# Patient Record
Sex: Male | Born: 1980 | Race: White | Hispanic: No | Marital: Married | Smoking: Current every day smoker
Health system: Southern US, Community
[De-identification: ages and names within clinical notes are randomized; demographics above are authoritative.]

## PROBLEM LIST (undated history)

## (undated) DIAGNOSIS — K219 Gastro-esophageal reflux disease without esophagitis: Secondary | ICD-10-CM

## (undated) DIAGNOSIS — I10 Essential (primary) hypertension: Secondary | ICD-10-CM

## (undated) HISTORY — PX: WISDOM TOOTH EXTRACTION: SHX21

---

## 2011-10-15 ENCOUNTER — Ambulatory Visit (INDEPENDENT_AMBULATORY_CARE_PROVIDER_SITE_OTHER): Payer: 59

## 2011-10-15 DIAGNOSIS — R52 Pain, unspecified: Secondary | ICD-10-CM

## 2011-10-15 DIAGNOSIS — L02219 Cutaneous abscess of trunk, unspecified: Secondary | ICD-10-CM

## 2011-10-17 ENCOUNTER — Ambulatory Visit (INDEPENDENT_AMBULATORY_CARE_PROVIDER_SITE_OTHER): Payer: 59

## 2011-10-17 DIAGNOSIS — L02219 Cutaneous abscess of trunk, unspecified: Secondary | ICD-10-CM

## 2011-10-17 DIAGNOSIS — L03319 Cellulitis of trunk, unspecified: Secondary | ICD-10-CM

## 2011-10-19 ENCOUNTER — Ambulatory Visit (INDEPENDENT_AMBULATORY_CARE_PROVIDER_SITE_OTHER): Payer: 59

## 2011-10-19 DIAGNOSIS — L03319 Cellulitis of trunk, unspecified: Secondary | ICD-10-CM

## 2011-10-21 ENCOUNTER — Ambulatory Visit (INDEPENDENT_AMBULATORY_CARE_PROVIDER_SITE_OTHER): Payer: 59 | Admitting: Physician Assistant

## 2011-10-21 VITALS — BP 142/84 | HR 68 | Temp 98.9°F | Resp 16 | Ht 75.5 in | Wt 238.0 lb

## 2011-10-21 DIAGNOSIS — L02219 Cutaneous abscess of trunk, unspecified: Secondary | ICD-10-CM

## 2011-10-21 DIAGNOSIS — L03319 Cellulitis of trunk, unspecified: Secondary | ICD-10-CM

## 2011-10-21 NOTE — Patient Instructions (Signed)
Complete Doxycycline as instructed.  No additional antibiotic should be necessary. Warm compresses to the area 2-3 times daily.

## 2011-10-21 NOTE — Progress Notes (Signed)
  Subjective:    Patient ID: Ryan Shannon, male    DOB: [birth year], 31 y.o.   MRN: [id number]  Wound Check He was originally treated 5 to 10 days ago. Previous treatment included I&D of abscess and oral antibiotics. The maximum temperature noted was less than 100.4 F. The temperature was taken using an oral thermometer. There has been bloody discharge from the wound. The redness has improved. The swelling has improved. The pain has improved. He has no difficulty moving the affected extremity or digit.      Review of Systems  HENT: Positive for sore throat (resolved with increased PO fluid).   Cardiovascular: Negative for chest pain.  Gastrointestinal: Negative for nausea, vomiting, diarrhea and constipation.  Genitourinary: Negative for dysuria and frequency.  Skin: Positive for wound. Negative for color change, pallor and rash.       Objective:   Physical Exam  Constitutional: He appears well-developed and well-nourished. No distress.  HENT:  Head: Normocephalic.  Eyes: Conjunctivae are normal.  Skin: Skin is warm and dry. Lesion noted. He is not diaphoretic.      Dressing removed, packing removed.  Additional sebaceous material expressed from lower pole of wound, consistent with communicating second lesion.  Irrigated wound with 3 cc 2% lidocaine plain.  Packed with 1/4 in. plain packing.  Dressed.       Assessment & Plan:

## 2011-10-23 ENCOUNTER — Ambulatory Visit (INDEPENDENT_AMBULATORY_CARE_PROVIDER_SITE_OTHER): Payer: 59 | Admitting: Physician Assistant

## 2011-10-23 VITALS — BP 143/79 | HR 83 | Temp 98.7°F | Resp 16 | Ht 75.5 in | Wt 243.0 lb

## 2011-10-23 DIAGNOSIS — L03319 Cellulitis of trunk, unspecified: Secondary | ICD-10-CM

## 2011-10-23 DIAGNOSIS — L02219 Cutaneous abscess of trunk, unspecified: Secondary | ICD-10-CM

## 2011-10-23 NOTE — Progress Notes (Signed)
  Subjective:    Patient ID: Ryan Shannon, male    DOB: 1980/12/09, 31 y.o.   MRN: [id number]  HPI Presents for wound care. Infected sebaceous cyst status post I and D. On 10/15/2011. Has completed doxycycline. Feels good.   Review of Systems Continues to have drainage from the surgical site. No pain.    Objective:   Physical Exam  Vitals reviewed. Constitutional: He appears well-developed and well-nourished.  Neurological: He is alert.  Skin: Skin is warm and dry. No rash noted. There is erythema (mild erythema surrounding surgical site.  ). No pallor.     mild induration surrounds the surgical site. Additional sebaceous material is expressed with deep palpation in the lower pole. The wound cavity was irrigated with 2 cc of 2% lidocaine plain. The cavity was then gently packed with quarter inch plain packing. Dressing applied.        Assessment & Plan:  Infected sebaceous cyst, mid back, status post I and D on 10/15/11.  Continue warm compresses. Reevaluate with me in 48 hours.

## 2011-10-25 ENCOUNTER — Ambulatory Visit (INDEPENDENT_AMBULATORY_CARE_PROVIDER_SITE_OTHER): Payer: 59 | Admitting: Physician Assistant

## 2011-10-25 VITALS — BP 130/90 | HR 76 | Temp 98.9°F | Resp 18 | Ht 75.5 in | Wt 242.0 lb

## 2011-10-25 DIAGNOSIS — L02219 Cutaneous abscess of trunk, unspecified: Secondary | ICD-10-CM

## 2011-10-25 DIAGNOSIS — L03319 Cellulitis of trunk, unspecified: Secondary | ICD-10-CM

## 2011-10-25 NOTE — Progress Notes (Signed)
  Subjective:    Patient ID: Ryan Shannon, male    DOB: 10-29-1980, 31 y.o.   MRN: [id number]  HPI  Presents for wound care. Infected sebaceous cyst status post I and D. On 10/15/2011. Has completed doxycycline. Feels good.   Review of Systems  Continues to have drainage from the surgical site. No pain.    Objective:   Physical Exam  Vitals reviewed. Constitutional: He appears well-developed and well-nourished.  Neurological: He is alert.  Skin: Skin is warm and dry. No rash noted. No erythema. No pallor.     mild induration surrounds the surgical site. Additional sebaceous material is expressed with deep palpation in the lower pole. The wound cavity was irrigated with 2 cc of 2% lidocaine plain. The cavity was then gently packed with quarter inch plain packing. Dressing applied.        Assessment & Plan:  Infected sebaceous cyst, mid back, status post I and D on 10/15/11.  Continue warm compresses. Reevaluate with Ryan Candle, PA-C in 48 hours.

## 2011-10-27 ENCOUNTER — Ambulatory Visit (INDEPENDENT_AMBULATORY_CARE_PROVIDER_SITE_OTHER): Payer: 59 | Admitting: Physician Assistant

## 2011-10-27 VITALS — BP 151/81 | HR 56 | Temp 98.0°F | Resp 18 | Ht 75.5 in | Wt 238.8 lb

## 2011-10-27 DIAGNOSIS — L02212 Cutaneous abscess of back [any part, except buttock]: Secondary | ICD-10-CM

## 2011-10-27 DIAGNOSIS — L02219 Cutaneous abscess of trunk, unspecified: Secondary | ICD-10-CM

## 2011-10-27 NOTE — Patient Instructions (Signed)

## 2011-10-27 NOTE — Progress Notes (Signed)
   Patient ID: Ryan Shannon MRN: [id number], DOB: 1981-09-08 31 y.o. Date of Encounter: 10/27/2011, 4:36 PM  Primary Physician: No primary provider on file.  Chief Complaint: Wound care   See previous note  HPI: 31 y.o. y/o Caucasian male presents for wound care of cellulitis/abscess of mid back s/p I&D on 10/15/11. Doing well No issues or complaints Afebrile/ no chills No nausea or vomiting Finished Doxycycline No pain Previous notes reviewed  No past medical history on file.   Home Meds: Prior to Admission medications   Not on File    Allergies: No Known Allergies  ROS: Constitutional: Afebrile, no chills Cardiovascular: negative for chest pain or palpitations Dermatological: Positive for wound. Negative for erythema, pain, or warmth GI: No nausea or vomiting   EXAM: Physical Exam: Blood pressure 151/81, pulse 56, temperature 98 F (36.7 C), temperature source Oral, resp. rate 18, height 6' 3.5" (1.918 m), weight 238 lb 12.8 oz (108.319 kg). General: Well developed, well nourished, in no acute distress. Nontoxic appearing. Head: Normocephalic, atraumatic, sclera non-icteric.  Neck: Supple. Lungs: Breathing is unlabored. Heart: Normal rate Skin:  Warm and moist. Dressing and packing in place along mid back. No induration, erythema, tenderness to palpation. Neuro: Alert and oriented X 3. Moves all extremities spontaneously. Normal gait.  Psych:  Responds to questions appropriately with a normal affect.   PROCEDURE: Dressing and packing removed. Small amount of sebaceous material expressed from inferior aspect of surgical site Wound bed healthy Irrigated with 1% plain lidocaine 5 cc. Repacked with 1/4 plain packing Dressing applied  LAB: Culture: reviewed. Multiple organisms present.   A/P: 31 y.o. y/o male with cellulitis/abscess as above s/p I&D on 10/15/11 -wound care per above -Finished Doxycycline -No pain -Daily dressing changes -Recheck 48  hours  Signed, Rodell Marrs, PA-C 10/27/2011, 4:43 PM

## 2011-10-29 ENCOUNTER — Ambulatory Visit (INDEPENDENT_AMBULATORY_CARE_PROVIDER_SITE_OTHER): Payer: 59 | Admitting: Physician Assistant

## 2011-10-29 VITALS — BP 144/80 | HR 78 | Temp 98.1°F | Resp 16 | Ht 75.25 in | Wt 242.2 lb

## 2011-10-29 DIAGNOSIS — L02219 Cutaneous abscess of trunk, unspecified: Secondary | ICD-10-CM

## 2011-10-29 DIAGNOSIS — L0390 Cellulitis, unspecified: Secondary | ICD-10-CM

## 2011-10-29 MED ORDER — DOXYCYCLINE HYCLATE 100 MG PO TABS: 100.0000 mg | ORAL_TABLET | Freq: Two times a day (BID) | ORAL | Status: AC

## 2011-10-29 MED ORDER — MUPIROCIN 2 % EX OINT: TOPICAL_OINTMENT | Freq: Three times a day (TID) | CUTANEOUS | Status: AC

## 2011-10-29 NOTE — Progress Notes (Signed)
  Subjective:    Patient ID: Ryan Shannon, male    DOB: [birth year], 31 y.o.   MRN: [id number]  HPIs/p I and D 10/15/2011.  No pain.  Doing great. Here for repacking.  Pt. c/o of getting new small pimple on buttocks    Review of Systems neg     Objective:   Physical Exam Center of back-packing removed.  Small, round opening with good granulation at base and no sign of infection or drainage.  Mupirocin 2% ointment used to lubricate packing and repacked w 1/4" plain packing ~5cm.  Telfa.       Assessment & Plan:  Reorder Doxy.  Start Mupirocin 2% on "pimple" on buttocks and in nares. See avs

## 2014-01-18 ENCOUNTER — Ambulatory Visit (INDEPENDENT_AMBULATORY_CARE_PROVIDER_SITE_OTHER): Payer: BC Managed Care – PPO | Admitting: Emergency Medicine

## 2014-01-18 VITALS — BP 126/80 | HR 94 | Temp 98.5°F | Resp 16 | Ht 75.5 in | Wt 255.0 lb

## 2014-01-18 DIAGNOSIS — L02219 Cutaneous abscess of trunk, unspecified: Secondary | ICD-10-CM

## 2014-01-18 DIAGNOSIS — L03319 Cellulitis of trunk, unspecified: Secondary | ICD-10-CM

## 2014-01-18 DIAGNOSIS — L02215 Cutaneous abscess of perineum: Secondary | ICD-10-CM

## 2014-01-18 MED ORDER — SULFAMETHOXAZOLE-TMP DS 800-160 MG PO TABS: 2.0000 | ORAL_TABLET | Freq: Two times a day (BID) | ORAL | Status: DC

## 2014-01-18 MED ORDER — ACETAMINOPHEN-CODEINE #3 300-30 MG PO TABS: 1.0000 | ORAL_TABLET | ORAL | Status: DC | PRN

## 2014-01-18 NOTE — Progress Notes (Signed)
[facility] [address], [place] [id number] [phone]  Date:  01/18/2014   Name:  Ryan Shannon   DOB:  December 08, 1980   MRN:  [id number]  PCP:  No primary provider on file.    Chief Complaint: abcess between legs   History of Present Illness:  Ryan Kearnsicholas C Cullen is a 33 y.o. very pleasant male patient who presents with the following:  Has mass in perineum for past two days.  Increasing tenderness and pain.  No drainage.  No fever or chills.  No improvement with over the counter medications or other home remedies. Denies other complaint or health concern today.   There are no active problems to display for this patient.   History reviewed. No pertinent past medical history.  History reviewed. No pertinent past surgical history.  History  Substance Use Topics  . Smoking status: Current Every Day Smoker -- 0.60 packs/day for 8 years    Types: Cigarettes  . Smokeless tobacco: Never Used  . Alcohol Use: 1.2 oz/week    2 Cans of beer per week    Family History  Problem Relation Age of Onset  . Diabetes Mother     No Known Allergies  Medication list has been reviewed and updated.  No current outpatient prescriptions on file prior to visit.   No current facility-administered medications on file prior to visit.    Review of Systems:  As per HPI, otherwise negative.    Physical Examination: Filed Vitals:   01/18/14 1222  BP: 126/80  Pulse: 94  Temp: 98.5 F (36.9 C)  Resp: 16   Filed Vitals:   01/18/14 1222  Height: 6' 3.5" (1.918 m)  Weight: 255 lb (115.667 kg)   Body mass index is 31.44 kg/(m^2). Ideal Body Weight: Weight in (lb) to have BMI = 25: 202.3   GEN: WDWN, NAD, Non-toxic, Alert & Oriented x 3 HEENT: Atraumatic, Normocephalic.  Ears and Nose: No external deformity. EXTR: No clubbing/cyanosis/edema NEURO: Normal gait.  PSYCH: Normally interactive. Conversant. Not depressed or anxious appearing.  Calm demeanor.   Genitalia:  Normal male circumcised Abscess on perineum in median raphe.  Assessment and Plan: Abscess Septra Surgical consultation Red flags reviewed  Signed,  Phillips OdorJeffery Kashis Penley, MD

## 2014-01-18 NOTE — Patient Instructions (Signed)
Abscess An abscess is an infected area that contains a collection of pus and debris.It can occur in almost any part of the body. An abscess is also known as a furuncle or boil. CAUSES  An abscess occurs when tissue gets infected. This can occur from blockage of oil or sweat glands, infection of hair follicles, or a minor injury to the skin. As the body tries to fight the infection, pus collects in the area and creates pressure under the skin. This pressure causes pain. People with weakened immune systems have difficulty fighting infections and get certain abscesses more often.  SYMPTOMS Usually an abscess develops on the skin and becomes a painful mass that is red, warm, and tender. If the abscess forms under the skin, you may feel a moveable soft area under the skin. Some abscesses break open (rupture) on their own, but most will continue to get worse without care. The infection can spread deeper into the body and eventually into the bloodstream, causing you to feel ill.  DIAGNOSIS  Your caregiver will take your medical history and perform a physical exam. A sample of fluid may also be taken from the abscess to determine what is causing your infection. TREATMENT  Your caregiver may prescribe antibiotic medicines to fight the infection. However, taking antibiotics alone usually does not cure an abscess. Your caregiver may need to make a small cut (incision) in the abscess to drain the pus. In some cases, gauze is packed into the abscess to reduce pain and to continue draining the area. HOME CARE INSTRUCTIONS   Only take over-the-counter or prescription medicines for pain, discomfort, or fever as directed by your caregiver.  If you were prescribed antibiotics, take them as directed. Finish them even if you start to feel better.  If gauze is used, follow your caregiver's directions for changing the gauze.  To avoid spreading the infection:  Keep your draining abscess covered with a  bandage.  Wash your hands well.  Do not share personal care items, towels, or whirlpools with others.  Avoid skin contact with others.  Keep your skin and clothes clean around the abscess.  Keep all follow-up appointments as directed by your caregiver. SEEK MEDICAL CARE IF:   You have increased pain, swelling, redness, fluid drainage, or bleeding.  You have muscle aches, chills, or a general ill feeling.  You have a fever. MAKE SURE YOU:   Understand these instructions.  Will watch your condition.  Will get help right away if you are not doing well or get worse. Document Released: 06/17/2005 Document Revised: 03/08/2012 Document Reviewed: 11/20/2011 [employer] Patient Information 2014 [employer].  

## 2014-11-01 ENCOUNTER — Ambulatory Visit
Admission: RE | Admit: 2014-11-01 | Discharge: 2014-11-01 | Disposition: A | Discharge status: Home / Self Care | Payer: BLUE CROSS/BLUE SHIELD | Source: Ambulatory Visit | Attending: Chiropractic Medicine | Admitting: Chiropractic Medicine

## 2014-11-01 ENCOUNTER — Other Ambulatory Visit: Payer: Self-pay | Admitting: Chiropractic Medicine

## 2014-11-01 DIAGNOSIS — M542 Cervicalgia: Secondary | ICD-10-CM

## 2014-11-01 DIAGNOSIS — M545 Low back pain: Secondary | ICD-10-CM
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
## 2015-08-30 ENCOUNTER — Other Ambulatory Visit: Payer: Self-pay | Admitting: Emergency Medicine

## 2018-01-19 DEATH — deceased

## 2024-08-03 ENCOUNTER — Emergency Department (HOSPITAL_COMMUNITY)

## 2024-08-03 ENCOUNTER — Ambulatory Visit (HOSPITAL_COMMUNITY)
Admission: EM | Admit: 2024-08-03 | Discharge: 2024-08-03 | Disposition: A | Discharge status: Home / Self Care | Attending: Emergency Medicine | Admitting: Emergency Medicine

## 2024-08-03 ENCOUNTER — Emergency Department (HOSPITAL_COMMUNITY): Admitting: Certified Registered Nurse Anesthetist

## 2024-08-03 ENCOUNTER — Encounter (HOSPITAL_COMMUNITY)
Admission: EM | Disposition: A | Discharge status: Home / Self Care | Payer: Self-pay | Source: Home / Self Care | Attending: Emergency Medicine

## 2024-08-03 ENCOUNTER — Encounter (HOSPITAL_COMMUNITY): Payer: Self-pay

## 2024-08-03 ENCOUNTER — Other Ambulatory Visit: Payer: Self-pay

## 2024-08-03 DIAGNOSIS — K219 Gastro-esophageal reflux disease without esophagitis: Secondary | ICD-10-CM | POA: Insufficient documentation

## 2024-08-03 DIAGNOSIS — K3580 Unspecified acute appendicitis: Secondary | ICD-10-CM

## 2024-08-03 DIAGNOSIS — K381 Appendicular concretions: Secondary | ICD-10-CM | POA: Diagnosis not present

## 2024-08-03 DIAGNOSIS — I10 Essential (primary) hypertension: Secondary | ICD-10-CM | POA: Insufficient documentation

## 2024-08-03 DIAGNOSIS — K353 Acute appendicitis with localized peritonitis, without perforation or gangrene: Secondary | ICD-10-CM | POA: Insufficient documentation

## 2024-08-03 DIAGNOSIS — F17210 Nicotine dependence, cigarettes, uncomplicated: Secondary | ICD-10-CM | POA: Diagnosis not present

## 2024-08-03 HISTORY — DX: Gastro-esophageal reflux disease without esophagitis: K21.9

## 2024-08-03 HISTORY — PX: LAPAROSCOPIC APPENDECTOMY: SHX408

## 2024-08-03 HISTORY — DX: Essential (primary) hypertension: I10

## 2024-08-03 LAB — GLUCOSE, CAPILLARY
Glucose-Capillary: 247 mg/dL — ABNORMAL HIGH (ref 70–99)
Glucose-Capillary: 208 mg/dL — ABNORMAL HIGH (ref 70–99)

## 2024-08-03 LAB — CBC
HCT: 49.2 % (ref 39.0–52.0)
Hemoglobin: 17.7 g/dL — ABNORMAL HIGH (ref 13.0–17.0)
MCH: 31 pg (ref 26.0–34.0)
MCHC: 36 g/dL (ref 30.0–36.0)
MCV: 86.2 fL (ref 80.0–100.0)
Platelets: 178 K/uL (ref 150–400)
RBC: 5.71 MIL/uL (ref 4.22–5.81)
RDW: 11.7 % (ref 11.5–15.5)
WBC: 15.2 K/uL — ABNORMAL HIGH (ref 4.0–10.5)
nRBC: 0 % (ref 0.0–0.2)

## 2024-08-03 LAB — COMPREHENSIVE METABOLIC PANEL WITH GFR
ALT: 157 U/L — ABNORMAL HIGH (ref 0–44)
AST: 84 U/L — ABNORMAL HIGH (ref 15–41)
Albumin: 3.8 g/dL (ref 3.5–5.0)
Alkaline Phosphatase: 101 U/L (ref 38–126)
Anion gap: 11 (ref 5–15)
BUN: 15 mg/dL (ref 6–20)
CO2: 22 mmol/L (ref 22–32)
Calcium: 9.1 mg/dL (ref 8.9–10.3)
Chloride: 100 mmol/L (ref 98–111)
Creatinine, Ser: 0.74 mg/dL (ref 0.61–1.24)
GFR, Estimated: >60 mL/min (ref >60)
Glucose, Bld: 254 mg/dL — ABNORMAL HIGH (ref 70–99)
Potassium: 3.9 mmol/L (ref 3.5–5.1)
Sodium: 133 mmol/L — ABNORMAL LOW (ref 135–145)
Total Bilirubin: 1.1 mg/dL (ref 0.0–1.2)
Total Protein: 6.6 g/dL (ref 6.5–8.1)

## 2024-08-03 LAB — LIPASE, BLOOD: Lipase: 34 U/L (ref 11–51)

## 2024-08-03 SURGERY — APPENDECTOMY, LAPAROSCOPIC
Anesthesia: General

## 2024-08-03 MED ORDER — ACETAMINOPHEN 10 MG/ML IV SOLN: 1000.0000 mg | Freq: Once | INTRAVENOUS | Status: DC | PRN

## 2024-08-03 MED ORDER — LIDOCAINE 2% (20 MG/ML) 5 ML SYRINGE
INTRAMUSCULAR | Status: AC
Start: 2024-08-03 — End: 2024-08-03
  Filled 2024-08-03: qty 5

## 2024-08-03 MED ORDER — SUCCINYLCHOLINE CHLORIDE 200 MG/10ML IV SOSY
PREFILLED_SYRINGE | INTRAVENOUS | Status: AC
  Filled 2024-08-03: qty 10

## 2024-08-03 MED ORDER — ROCURONIUM BROMIDE 10 MG/ML (PF) SYRINGE
PREFILLED_SYRINGE | INTRAVENOUS | Status: AC
Start: 2024-08-03 — End: 2024-08-03
  Filled 2024-08-03: qty 10

## 2024-08-03 MED ORDER — OXYCODONE HCL 5 MG PO TABS: 5.0000 mg | ORAL_TABLET | Freq: Four times a day (QID) | ORAL | 0 refills | Status: AC | PRN

## 2024-08-03 MED ORDER — MIDAZOLAM HCL (PF) 2 MG/2ML IJ SOLN
INTRAMUSCULAR | Status: DC | PRN
Start: 2024-08-03 — End: 2024-08-03
  Administered 2024-08-03: 2 mg via INTRAVENOUS

## 2024-08-03 MED ORDER — LIDOCAINE 2% (20 MG/ML) 5 ML SYRINGE
INTRAMUSCULAR | Status: DC | PRN
  Administered 2024-08-03: 40 mg via INTRAVENOUS

## 2024-08-03 MED ORDER — MIDAZOLAM HCL 2 MG/2ML IJ SOLN
INTRAMUSCULAR | Status: AC
Start: 2024-08-03 — End: 2024-08-03
  Filled 2024-08-03: qty 2

## 2024-08-03 MED ORDER — PROPOFOL 10 MG/ML IV BOLUS
INTRAVENOUS | Status: DC | PRN
  Administered 2024-08-03: 200 mg via INTRAVENOUS

## 2024-08-03 MED ORDER — PHENYLEPHRINE 80 MCG/ML (10ML) SYRINGE FOR IV PUSH (FOR BLOOD PRESSURE SUPPORT)
PREFILLED_SYRINGE | INTRAVENOUS | Status: AC
  Filled 2024-08-03: qty 10

## 2024-08-03 MED ORDER — PROPOFOL 10 MG/ML IV BOLUS
INTRAVENOUS | Status: AC
  Filled 2024-08-03: qty 20

## 2024-08-03 MED ORDER — 0.9 % SODIUM CHLORIDE (POUR BTL) OPTIME
TOPICAL | Status: DC | PRN
  Administered 2024-08-03: 1000 mL

## 2024-08-03 MED ORDER — PHENYLEPHRINE 80 MCG/ML (10ML) SYRINGE FOR IV PUSH (FOR BLOOD PRESSURE SUPPORT)
PREFILLED_SYRINGE | INTRAVENOUS | Status: DC | PRN
  Administered 2024-08-03: 160 ug via INTRAVENOUS
  Administered 2024-08-03 (×2): 80 ug via INTRAVENOUS

## 2024-08-03 MED ORDER — OXYCODONE HCL 5 MG/5ML PO SOLN: 5.0000 mg | Freq: Once | ORAL | Status: DC | PRN

## 2024-08-03 MED ORDER — SUCCINYLCHOLINE CHLORIDE 200 MG/10ML IV SOSY
PREFILLED_SYRINGE | INTRAVENOUS | Status: DC | PRN
  Administered 2024-08-03: 140 mg via INTRAVENOUS

## 2024-08-03 MED ORDER — SODIUM CHLORIDE 0.9 % IR SOLN
Status: DC | PRN
Start: 2024-08-03 — End: 2024-08-03
  Administered 2024-08-03: 1

## 2024-08-03 MED ORDER — SODIUM CHLORIDE 0.9 % IV SOLN
2.0000 g | INTRAVENOUS | Status: DC
  Administered 2024-08-03: 2 g via INTRAVENOUS
  Filled 2024-08-03: qty 20

## 2024-08-03 MED ORDER — CHLORHEXIDINE GLUCONATE 0.12 % MT SOLN
15.0000 mL | Freq: Once | OROMUCOSAL | Status: AC
  Administered 2024-08-03: 15 mL via OROMUCOSAL
  Filled 2024-08-03: qty 15

## 2024-08-03 MED ORDER — OXYCODONE HCL 5 MG PO TABS: 5.0000 mg | ORAL_TABLET | Freq: Once | ORAL | Status: DC | PRN

## 2024-08-03 MED ORDER — MORPHINE SULFATE (PF) 4 MG/ML IV SOLN
4.0000 mg | Freq: Once | INTRAVENOUS | Status: AC
  Administered 2024-08-03: 4 mg via INTRAVENOUS
  Filled 2024-08-03: qty 1

## 2024-08-03 MED ORDER — LACTATED RINGERS IV SOLN: INTRAVENOUS | Status: DC

## 2024-08-03 MED ORDER — ORAL CARE MOUTH RINSE: 15.0000 mL | Freq: Once | OROMUCOSAL | Status: AC

## 2024-08-03 MED ORDER — BUPIVACAINE-EPINEPHRINE (PF) 0.25% -1:200000 IJ SOLN
INTRAMUSCULAR | Status: AC
  Filled 2024-08-03: qty 30

## 2024-08-03 MED ORDER — ROCURONIUM BROMIDE 10 MG/ML (PF) SYRINGE
PREFILLED_SYRINGE | INTRAVENOUS | Status: DC | PRN
Start: 2024-08-03 — End: 2024-08-03
  Administered 2024-08-03: 50 mg via INTRAVENOUS

## 2024-08-03 MED ORDER — ONDANSETRON HCL 4 MG/2ML IJ SOLN
INTRAMUSCULAR | Status: AC
  Filled 2024-08-03: qty 2

## 2024-08-03 MED ORDER — IOHEXOL 350 MG/ML SOLN
75.0000 mL | Freq: Once | INTRAVENOUS | Status: AC | PRN
  Administered 2024-08-03: 75 mL via INTRAVENOUS

## 2024-08-03 MED ORDER — ONDANSETRON HCL 4 MG/2ML IJ SOLN
INTRAMUSCULAR | Status: DC | PRN
  Administered 2024-08-03: 4 mg via INTRAVENOUS

## 2024-08-03 MED ORDER — DEXAMETHASONE SOD PHOSPHATE PF 10 MG/ML IJ SOLN
INTRAMUSCULAR | Status: DC | PRN
  Administered 2024-08-03: 10 mg via INTRAVENOUS

## 2024-08-03 MED ORDER — FENTANYL CITRATE (PF) 100 MCG/2ML IJ SOLN: 25.0000 ug | INTRAMUSCULAR | Status: DC | PRN

## 2024-08-03 MED ORDER — BUPIVACAINE-EPINEPHRINE 0.25% -1:200000 IJ SOLN
INTRAMUSCULAR | Status: DC | PRN
  Administered 2024-08-03: 8 mL

## 2024-08-03 MED ORDER — DROPERIDOL 2.5 MG/ML IJ SOLN: 0.6250 mg | Freq: Once | INTRAMUSCULAR | Status: DC | PRN

## 2024-08-03 MED ORDER — FENTANYL CITRATE (PF) 250 MCG/5ML IJ SOLN
INTRAMUSCULAR | Status: DC | PRN
Start: 2024-08-03 — End: 2024-08-03
  Administered 2024-08-03 (×5): 50 ug via INTRAVENOUS

## 2024-08-03 MED ORDER — METRONIDAZOLE 500 MG/100ML IV SOLN
500.0000 mg | Freq: Two times a day (BID) | INTRAVENOUS | Status: DC
  Administered 2024-08-03: 500 mg via INTRAVENOUS
  Filled 2024-08-03: qty 100

## 2024-08-03 MED ORDER — SUGAMMADEX SODIUM 200 MG/2ML IV SOLN
INTRAVENOUS | Status: DC | PRN
  Administered 2024-08-03: 200 mg via INTRAVENOUS

## 2024-08-03 MED ORDER — FENTANYL CITRATE (PF) 250 MCG/5ML IJ SOLN
INTRAMUSCULAR | Status: AC
  Filled 2024-08-03: qty 5

## 2024-08-03 SURGICAL SUPPLY — 35 items
CANISTER SUCTION 3000ML PPV (SUCTIONS) ×1 IMPLANT
CHLORAPREP W/TINT 26 (MISCELLANEOUS) ×1 IMPLANT
CLIP APPLIE 5 13 M/L LIGAMAX5 (MISCELLANEOUS) IMPLANT
COVER SURGICAL LIGHT HANDLE (MISCELLANEOUS) ×1 IMPLANT
CUTTER ECHEON FLEX ENDO 45 340 (ENDOMECHANICALS) IMPLANT
DERMABOND ADVANCED .7 DNX12 (GAUZE/BANDAGES/DRESSINGS) ×1 IMPLANT
DRAPE LAPAROSCOPIC ABDOMINAL (DRAPES) IMPLANT
ELECTRODE REM PT RTRN 9FT ADLT (ELECTROSURGICAL) ×1 IMPLANT
GLOVE BIO SURGEON STRL SZ7 (GLOVE) ×1 IMPLANT
GLOVE BIOGEL PI IND STRL 7.5 (GLOVE) ×1 IMPLANT
GOWN STRL REUS W/ TWL LRG LVL3 (GOWN DISPOSABLE) ×3 IMPLANT
GRASPER SUT TROCAR 14GX15 (MISCELLANEOUS) ×1 IMPLANT
IRRIGATION SUCT STRKRFLW 2 WTP (MISCELLANEOUS) ×1 IMPLANT
KIT BASIN OR (CUSTOM PROCEDURE TRAY) ×1 IMPLANT
KIT TURNOVER KIT B (KITS) ×1 IMPLANT
PAD ARMBOARD POSITIONER FOAM (MISCELLANEOUS) ×2 IMPLANT
POUCH RETRIEVAL ECOSAC 10 (ENDOMECHANICALS) ×1 IMPLANT
RELOAD STAPLE 45 3.6 BLU REG (STAPLE) ×1 IMPLANT
SCISSORS LAP 5X35 DISP (ENDOMECHANICALS) IMPLANT
SET TUBE SMOKE EVAC HIGH FLOW (TUBING) ×1 IMPLANT
SHEARS HARMONIC 36 ACE (MISCELLANEOUS) ×1 IMPLANT
SLEEVE Z-THREAD 5X100MM (TROCAR) ×1 IMPLANT
SOLN 0.9% NACL POUR BTL 1000ML (IV SOLUTION) ×1 IMPLANT
SOLN STERILE WATER BTL 1000 ML (IV SOLUTION) ×1 IMPLANT
STAPLER POWER ECHELON 45 WIDE (STAPLE) ×1 IMPLANT
STRIP CLOSURE SKIN 1/2X4 (GAUZE/BANDAGES/DRESSINGS) ×1 IMPLANT
SUT MNCRL AB 4-0 PS2 18 (SUTURE) ×1 IMPLANT
SUT VICRYL 0 UR6 27IN ABS (SUTURE) ×1 IMPLANT
TOWEL GREEN STERILE (TOWEL DISPOSABLE) ×1 IMPLANT
TOWEL GREEN STERILE FF (TOWEL DISPOSABLE) ×1 IMPLANT
TRAY FOLEY MTR SLVR 16FR STAT (SET/KITS/TRAYS/PACK) IMPLANT
TRAY LAPAROSCOPIC MC (CUSTOM PROCEDURE TRAY) ×1 IMPLANT
TROCAR BALLN 12MMX100 BLUNT (TROCAR) ×1 IMPLANT
TROCAR Z-THREAD OPTICAL 5X100M (TROCAR) ×1 IMPLANT
WARMER LAPAROSCOPE (MISCELLANEOUS) ×1 IMPLANT

## 2024-08-03 NOTE — Anesthesia Preprocedure Evaluation (Addendum)
 Anesthesia Evaluation  Patient identified by MRN, date of birth, ID band Patient awake    Reviewed: Allergy & Precautions, NPO status , Patient's Chart, lab work & pertinent test results  Airway Mallampati: II  TM Distance: >3 FB Neck ROM: Full    Dental  (+) Teeth Intact, Dental Advisory Given   Pulmonary Current Smoker and Patient abstained from smoking.   breath sounds clear to auscultation       Cardiovascular hypertension, Pt. on medications  Rhythm:Regular Rate:Normal     Neuro/Psych negative neurological ROS  negative psych ROS   GI/Hepatic Neg liver ROS,GERD  ,,  Endo/Other  negative endocrine ROS    Renal/GU negative Renal ROS     Musculoskeletal negative musculoskeletal ROS (+)    Abdominal   Peds  Hematology negative hematology ROS (+)   Anesthesia Other Findings   Reproductive/Obstetrics                              Anesthesia Physical Anesthesia Plan  ASA: 2 and emergent  Anesthesia Plan: General   Post-op Pain Management: Tylenol  PO (pre-op)* and Toradol IV (intra-op)*   Induction: Intravenous  PONV Risk Score and Plan: 2 and Ondansetron, Dexamethasone and Midazolam  Airway Management Planned: Oral ETT  Additional Equipment: None  Intra-op Plan:   Post-operative Plan: Extubation in OR  Informed Consent: I have reviewed the patients History and Physical, chart, labs and discussed the procedure including the risks, benefits and alternatives for the proposed anesthesia with the patient or authorized representative who has indicated his/her understanding and acceptance.     Dental advisory given  Plan Discussed with: CRNA  Anesthesia Plan Comments:          Anesthesia Quick Evaluation

## 2024-08-03 NOTE — ED Notes (Signed)
 Pt given urine cup. Unable to provide sample at time of triage.

## 2024-08-03 NOTE — Anesthesia Postprocedure Evaluation (Signed)
 Anesthesia Post Note  Patient: Ryan Shannon  Procedure(s) Performed: APPENDECTOMY, LAPAROSCOPIC     Patient location during evaluation: PACU Anesthesia Type: General Level of consciousness: awake and alert Pain management: pain level controlled Vital Signs Assessment: post-procedure vital signs reviewed and stable Respiratory status: spontaneous breathing, nonlabored ventilation, respiratory function stable and patient connected to nasal cannula oxygen Cardiovascular status: blood pressure returned to baseline and stable Postop Assessment: no apparent nausea or vomiting Anesthetic complications: no   No notable events documented.  Last Vitals:  Vitals:   08/03/24 1145 08/03/24 1200  BP: (!) 140/82 120/65  Pulse: 78 67  Resp: 17 16  Temp:    SpO2: 90% 93%    Last Pain:  Vitals:   08/03/24 1200  TempSrc:   PainSc: Asleep                 Rome Ade

## 2024-08-03 NOTE — Transfer of Care (Signed)
 Immediate Anesthesia Transfer of Care Note  Patient: Ryan Shannon  Procedure(s) Performed: APPENDECTOMY, LAPAROSCOPIC  Patient Location: PACU  Anesthesia Type:General  Level of Consciousness: awake and alert   Airway & Oxygen Therapy: Patient Spontanous Breathing  Post-op Assessment: Report given to RN, Post -op Vital signs reviewed and stable, and Patient moving all extremities X 4  Post vital signs: Reviewed and stable  Last Vitals:  Vitals Value Taken Time  BP 140/87   Temp    Pulse 89 08/03/24 11:42  Resp 15 08/03/24 11:42  SpO2 94 % 08/03/24 11:42  Vitals shown include unfiled device data.  Last Pain:  Vitals:   08/03/24 0833  TempSrc:   PainSc: 7          Complications: No notable events documented.

## 2024-08-03 NOTE — ED Triage Notes (Signed)
 Patient reports RLQ pain onset this morning with constipation , denies emesis or diarrhea .

## 2024-08-03 NOTE — ED Notes (Signed)
Pt. transported to Preop

## 2024-08-03 NOTE — Discharge Instructions (Signed)

## 2024-08-03 NOTE — Op Note (Signed)
 Preoperative diagnosis: Acute appendicitis Postoperative diagnosis: Same as above Procedure: Laparoscopic appendectomy Surgeon: Dr. Adina Bury Anesthesia: General Estimated blood loss: Minimal Complications: None Drains: None Sponge needle count was correct completion Specimens: Appendix to pathology   Indications: This is a healthy 43 year old male with less than 24 hours of right lower quadrant pain and a CT scan consistent with appendicitis.  We discussed proceeding with a laparoscopic appendectomy.   Procedure: After informed consent was obtained he was taken to the operating room.  He was already on antibiotics.  SCDs were placed.  He was placed under general anesthesia without complication.  He was prepped and draped in standard sterile surgical fashion.  Surgical timeout was then performed.   I infiltrated marcaine below the umbilicus. I made an incision and grasped the fascia. I entered the peritoneum bluntly.  I placed a 0 vicryl pursestring suture through the fascia and inserted a hasson trocar. I then insufflated the abdomen to 15 mm Hg pressure.  I placed two additional 5 mm trocars in the lower abdomen.   I then identified his cecum and terminal ileum.  His terminal ileum was adherent to his sidewall.  I released this with scissors.  I also released his white line and rolled his right colon medially to find the retrocecal appendix.  I was able to dissect this free from the retroperitoneum.  I was able to identify the base and I divided the appendiceal mesentery with the harmonic scalpel.  I identified the appendix entering the cecum and stapled across the base.   This was placed in retrieval bag and removed from the abdomen.  Hemostasis was observed.  The staple line was not bleeding.  There was no evidence of any injury to any the surrounding structures.  I then removed the hasson trocar. I tied my pursestring down and then  I used a 0 Vicryl suture with the suture passer device to  completely obliterate this defect.  The remaining trocars were removed and the abdomen desufflated.  I then closed these with 4-0 Monocryl and glue.  He tolerated this well was extubated and transferred recovery stable.

## 2024-08-03 NOTE — Discharge Summary (Signed)
 [facility] Discharge Summary   Patient ID: Ryan Shannon MRN: [id number] DOB/AGE: [birth year] 43 y.o.  Admit date: 08/03/2024 Discharge date: 08/03/2024  Admitting Diagnosis: Acute appendicitis   Discharge Diagnosis S/P laparoscopic appendectomy   Consultants None   Imaging: CT ABDOMEN PELVIS W CONTRAST Result Date: 08/03/2024 EXAM: CT ABDOMEN AND PELVIS WITH CONTRAST 08/03/2024 06:35:12 AM TECHNIQUE: CT of the abdomen and pelvis was performed with the administration of 75 mL of iohexol (OMNIPAQUE) 350 MG/ML injection. Multiplanar reformatted images are provided for review. Automated exposure control, iterative reconstruction, and/or weight-based adjustment of the mA/kV was utilized to reduce the radiation dose to as low as reasonably achievable. COMPARISON: None available. CLINICAL HISTORY: RLQ abdominal pain. FINDINGS: LOWER CHEST: No acute abnormality. LIVER: The liver is 22 cm in length and moderately steatotic without mass or enhancement. GALLBLADDER AND BILE DUCTS: Gallbladder is unremarkable. No biliary ductal dilatation. SPLEEN: The spleen is mildly enlarged, measuring 15.2 cm transverse, without mass. PANCREAS: There is mild fatty infiltration in the pancreas, but no mass, ductal dilatation or inflammatory change. ADRENAL GLANDS: No acute abnormality. KIDNEYS, URETERS AND BLADDER: No stones in the kidneys or ureters. No hydronephrosis. No perinephric or periureteral stranding. Urinary bladder is unremarkable. GI AND BOWEL: Stomach demonstrates no acute abnormality. There is no bowel obstruction. Normal caliber small bowel without inflammatory change. There is a 1 cm stone in the proximal appendix, with fluid distention and inflammation of the remainder consistent with appendicitis. Maximum appendiceal caliber 1.4 cm with trace periappendiceal reactive fluid but no overt appendiceal rupture or abscess. The colon is contracted and unremarkable. PERITONEUM AND  RETROPERITONEUM: No ascites. No free air. VASCULATURE: Aorta is normal in caliber. There is moderate aortoiliac calcific plaque, age advanced. LYMPH NODES: No lymphadenopathy. REPRODUCTIVE ORGANS: Enlarged prostate, measures 4.7 cm transverse. BONES AND SOFT TISSUES: There are mild degenerative disc changes in the lower thoracic spine. Multiple bone islands in both femoral heads. There are small umbilical and inguinal fat hernias. No incarcerated hernia. No acute osseous abnormality. No focal soft tissue abnormality. IMPRESSION: 1. Acute appendicitis with 1 cm appendicolith in the proximal lumen, dilated appendix, and periappendiceal inflammation without rupture or abscess. 2. Moderate aortoiliac atherosclerotic calcification, age-advanced. 3. PRA is attempting to reach the ordering physician for stat notification at the time of signing. 4. Moderate hepatic steatosis. Mild splenomegaly. Electronically signed by: Francis Quam MD 08/03/2024 07:08 AM EST RP Workstation: [id number]    Procedures Dr. Donnice Bury (08/03/24) - Laparoscopic Appendectomy  Hospital Course:  Patient is a 43 year old male who presented to the ED with abdominal pain.  Workup showed acute appendicitis.  Patient was admitted and underwent procedure listed above.  Tolerated procedure well and was discharged from PACU post-operatively. He will follow up in the office in 3-4 weeks.   I or a member of my team have reviewed this patient in the Controlled Substance Database.   Allergies as of 08/03/2024   No Known Allergies      Medication List     TAKE these medications    amLODipine 5 MG tablet Commonly known as: NORVASC Take 5 mg by mouth daily.   b complex vitamins capsule Take 1 capsule by mouth daily.   esomeprazole 20 MG capsule Commonly known as: NEXIUM Take 20 mg by mouth daily at 12 noon.   FISH OIL PO Take 1 capsule by mouth daily.   MAGNESIUM GLYCINATE PO Take 1 tablet by mouth at bedtime.    MAGNESIUM PO Take 1 tablet  by mouth daily.   naproxen sodium 220 MG tablet Commonly known as: ALEVE Take 440 mg by mouth 2 (two) times daily as needed (pain).   olmesartan-hydrochlorothiazide 40-25 MG tablet Commonly known as: BENICAR HCT Take 1 tablet by mouth daily.   oxyCODONE 5 MG immediate release tablet Commonly known as: Oxy IR/ROXICODONE Take 1 tablet (5 mg total) by mouth every 6 (six) hours as needed for severe pain (pain score 7-10).   POTASSIUM PO Take 1 tablet by mouth daily.   VITAMIN D-3 PO Take 1 capsule by mouth daily.          Follow-up Information     Maczis, Puja Gosai, PA-C Follow up.   Specialty: General Surgery Why: 12/9 at 10:15. Please bring a copy of your photo ID, insurance card and arrive 30 minutes prior to your appointment for paperwork. Contact information: [address] [place] [address] [phone]                 Signed: Burnard JONELLE Louder , [facility] 08/03/2024, 11:37 AM Please see Amion for pager number during day hours 7:00am-4:30pm

## 2024-08-03 NOTE — ED Provider Notes (Signed)
 [name] [facility]PARTMENT AT [facility] Provider Note   CSN: [id number] Arrival date & time: 08/03/24  9587     Patient presents with: Abdominal Pain   Ryan Shannon is a 43 y.o. male.  With a history of hypertension who presents to ED for abdominal pain.  Patient awoke around midnight with periumbilical abdominal pain and had a hard time falling back asleep secondary to this pain.  Since then pain has migrated to right lower quadrant.  No associated nausea vomiting diarrhea fevers chills.  Last p.o. intake around 0500.  No prior history of abdominal surgeries    Abdominal Pain      Prior to Admission medications   Medication Sig Start Date End Date Taking? Authorizing Provider  acetaminophen -codeine  (TYLENOL  #3) 300-30 MG per tablet Take 1-2 tablets by mouth every 4 (four) hours as needed. 01/18/14   Lenon Juliane RAMAN, MD  sulfamethoxazole -trimethoprim (BACTRIM DS) 800-160 MG per tablet Take 2 tablets by mouth 2 (two) times daily. 01/18/14   Lenon Juliane RAMAN, MD    Allergies: Patient has no known allergies.    Review of Systems  Gastrointestinal:  Positive for abdominal pain.    Updated Vital Signs BP (!) 147/88 (BP Location: Right Arm)   Pulse 73   Temp 99 F (37.2 C)   Resp 20   SpO2 100%   Physical Exam Vitals and nursing note reviewed.  HENT:     Head: Normocephalic and atraumatic.  Eyes:     Pupils: Pupils are equal, round, and reactive to light.  Cardiovascular:     Rate and Rhythm: Normal rate and regular rhythm.  Pulmonary:     Effort: Pulmonary effort is normal.     Breath sounds: Normal breath sounds.  Abdominal:     Palpations: Abdomen is soft.     Tenderness: There is abdominal tenderness in the right lower quadrant. There is no guarding or rebound.  Skin:    General: Skin is warm and dry.  Neurological:     Mental Status: He is alert.  Psychiatric:        Mood and Affect: Mood normal.     (all labs ordered are  listed, but only abnormal results are displayed) Labs Reviewed  COMPREHENSIVE METABOLIC PANEL WITH GFR - Abnormal; Notable for the following components:      Result Value   Sodium 133 (*)    Glucose, Bld 254 (*)    AST 84 (*)    ALT 157 (*)    All other components within normal limits  CBC - Abnormal; Notable for the following components:   WBC 15.2 (*)    Hemoglobin 17.7 (*)    All other components within normal limits  LIPASE, BLOOD  URINALYSIS, ROUTINE W REFLEX MICROSCOPIC    EKG: None  Radiology: CT ABDOMEN PELVIS W CONTRAST Result Date: 08/03/2024 EXAM: CT ABDOMEN AND PELVIS WITH CONTRAST 08/03/2024 06:35:12 AM TECHNIQUE: CT of the abdomen and pelvis was performed with the administration of 75 mL of iohexol (OMNIPAQUE) 350 MG/ML injection. Multiplanar reformatted images are provided for review. Automated exposure control, iterative reconstruction, and/or weight-based adjustment of the mA/kV was utilized to reduce the radiation dose to as low as reasonably achievable. COMPARISON: None available. CLINICAL HISTORY: RLQ abdominal pain. FINDINGS: LOWER CHEST: No acute abnormality. LIVER: The liver is 22 cm in length and moderately steatotic without mass or enhancement. GALLBLADDER AND BILE DUCTS: Gallbladder is unremarkable. No biliary ductal dilatation. SPLEEN: The spleen is mildly enlarged,  measuring 15.2 cm transverse, without mass. PANCREAS: There is mild fatty infiltration in the pancreas, but no mass, ductal dilatation or inflammatory change. ADRENAL GLANDS: No acute abnormality. KIDNEYS, URETERS AND BLADDER: No stones in the kidneys or ureters. No hydronephrosis. No perinephric or periureteral stranding. Urinary bladder is unremarkable. GI AND BOWEL: Stomach demonstrates no acute abnormality. There is no bowel obstruction. Normal caliber small bowel without inflammatory change. There is a 1 cm stone in the proximal appendix, with fluid distention and inflammation of the remainder  consistent with appendicitis. Maximum appendiceal caliber 1.4 cm with trace periappendiceal reactive fluid but no overt appendiceal rupture or abscess. The colon is contracted and unremarkable. PERITONEUM AND RETROPERITONEUM: No ascites. No free air. VASCULATURE: Aorta is normal in caliber. There is moderate aortoiliac calcific plaque, age advanced. LYMPH NODES: No lymphadenopathy. REPRODUCTIVE ORGANS: Enlarged prostate, measures 4.7 cm transverse. BONES AND SOFT TISSUES: There are mild degenerative disc changes in the lower thoracic spine. Multiple bone islands in both femoral heads. There are small umbilical and inguinal fat hernias. No incarcerated hernia. No acute osseous abnormality. No focal soft tissue abnormality. IMPRESSION: 1. Acute appendicitis with 1 cm appendicolith in the proximal lumen, dilated appendix, and periappendiceal inflammation without rupture or abscess. 2. Moderate aortoiliac atherosclerotic calcification, age-advanced. 3. PRA is attempting to reach the ordering physician for stat notification at the time of signing. 4. Moderate hepatic steatosis. Mild splenomegaly. Electronically signed by: Francis Quam MD 08/03/2024 07:08 AM EST RP Workstation: [id number]     Procedures   Medications Ordered in the ED  iohexol (OMNIPAQUE) 350 MG/ML injection 75 mL (75 mLs Intravenous Contrast Given 08/03/24 0637)  morphine (PF) 4 MG/ML injection 4 mg (4 mg Intravenous Given 08/03/24 0725)    Clinical Course as of 08/03/24 0748  Thu Aug 03, 2024  0707 Call taken from radiology.  Appendicitis.  Will page surgery [MP]  [id number] Discussed with Dr. Ebbie (surgery).  Surgery team will evaluate in ED and plan for appendectomy [MP]    Clinical Course User Index [MP] Pamella Ozell LABOR, DO                                 Medical Decision Making 43 year old male with history as above presenting for acute abdominal pain beginning overnight.  Periumbilical with migration to right lower quadrant.   CT abdomen shows acute appendicitis without perforation.  Leukocytosis.  Discussed with Dr. Ebbie (surgery).  Surgery team will evaluate patient in ED and plan for appendectomy today.  Pain control with morphine  Amount and/or Complexity of Data Reviewed Labs: ordered.  Risk Prescription drug management.        Final diagnoses:  Acute appendicitis, unspecified acute appendicitis type    ED Discharge Orders     None          Pamella Ozell LABOR, DO 08/03/24 [phone]

## 2024-08-03 NOTE — Progress Notes (Signed)
 Dr. Tilford, and Dr. Ebbie made aware of the pt stated that he had eaten two peanut butter crackers and water at 0500.

## 2024-08-03 NOTE — H&P (Signed)
 Ryan Shannon 06-Mar-1981  [id number].    Requesting MD: Dr. Ozell Marine Chief Complaint/Reason for Consult: Acute Appendicitis  HPI: Ryan Shannon is a 43 y.o. male with a hx of HTN per report of presented to the ED with abdominal pain. Patient reports periumbilical abdominal pain that began last night. Pain localized to RLQ by 1:30am. Associated chills. No fever, n/v. Had a normal, non-bloody BM since onset. No hx of similar. No hx of IBD. No prior colonoscopy. No prior abdominal surgeries. He is not on blood thinners. NKDA. Last PO intake was 5am - 2 crackers.   Accompanied by his wife. Drinks 1-2 alcoholic beverages/day. Smokes 5-6 cigarettes/day. Works in web designer. He denies heart or lung issues. No prior CVA or MI. Able to walk up a flight of stairs without cp or sob.   ROS: ROS As above, see hpi  Family History  Problem Relation Age of Onset   Diabetes Mother     No past medical history on file.  No past surgical history on file.  Social History:  reports that he has been smoking cigarettes. He has a 4.8 pack-year smoking history. He has never used smokeless tobacco. He reports current alcohol use of about 2.0 standard drinks of alcohol per week. He reports that he does not use drugs.  Allergies: No Known Allergies  (Not in a hospital admission)    Physical Exam: Blood pressure (!) 147/88, pulse 73, temperature 99 F (37.2 C), resp. rate 20, SpO2 100%. General: pleasant, WD/WN male who is laying in bed in NAD HEENT: head is normocephalic, atraumatic.  Sclera are non-icteric.  Heart: regular, rate, and rhythm.   Lungs: Respiratory effort nonlabored Abd:  Soft, ND, RLQ ttp, +BS. No masses, hernias, or organomegaly MS: no BUE or BLE edema Skin: warm and dry  Psych: A&Ox4 with an appropriate affect Neuro: normal speech, thought process intact, moves all extremities, gait not assessed   Results for orders placed or performed  during the hospital encounter of 08/03/24 (from the past 48 hours)  Lipase, blood     Status: None   Collection Time: 08/03/24  4:21 AM  Result Value Ref Range   Lipase 34 11 - 51 U/L    Comment: Performed at [facility], [address], [place], [place] [address]  Comprehensive metabolic panel     Status: Abnormal   Collection Time: 08/03/24  4:21 AM  Result Value Ref Range   Sodium 133 (L) 135 - 145 mmol/L   Potassium 3.9 3.5 - 5.1 mmol/L   Chloride 100 98 - 111 mmol/L   CO2 22 22 - 32 mmol/L   Glucose, Bld 254 (H) 70 - 99 mg/dL    Comment: Glucose reference range applies only to samples taken after fasting for at least 8 hours.   BUN 15 6 - 20 mg/dL   Creatinine, Ser 9.25 0.61 - 1.24 mg/dL   Calcium 9.1 8.9 - 89.6 mg/dL   Total Protein 6.6 6.5 - 8.1 g/dL   Albumin 3.8 3.5 - 5.0 g/dL   AST 84 (H) 15 - 41 U/L   ALT 157 (H) 0 - 44 U/L   Alkaline Phosphatase 101 38 - 126 U/L   Total Bilirubin 1.1 0.0 - 1.2 mg/dL   GFR, Estimated >39 >39 mL/min    Comment: (NOTE) Calculated using the CKD-EPI Creatinine Equation (2021)    Anion gap 11 5 - 15    Comment: Performed  at [facility], [address] [place], [place] [address]  CBC     Status: Abnormal   Collection Time: 08/03/24  4:21 AM  Result Value Ref Range   WBC 15.2 (H) 4.0 - 10.5 K/uL   RBC 5.71 4.22 - 5.81 MIL/uL   Hemoglobin 17.7 (H) 13.0 - 17.0 g/dL   HCT 50.7 60.9 - 47.9 %   MCV 86.2 80.0 - 100.0 fL   MCH 31.0 26.0 - 34.0 pg   MCHC 36.0 30.0 - 36.0 g/dL   RDW 88.2 88.4 - 84.4 %   Platelets 178 150 - 400 K/uL   nRBC 0.0 0.0 - 0.2 %    Comment: Performed at [facility], [address], [place], [place] [address]   CT ABDOMEN PELVIS W CONTRAST Result Date: 08/03/2024 EXAM: CT ABDOMEN AND PELVIS WITH CONTRAST 08/03/2024 06:35:12 AM TECHNIQUE: CT of the abdomen and pelvis was performed with the administration of 75 mL of iohexol (OMNIPAQUE) 350 MG/ML injection. Multiplanar reformatted images are  provided for review. Automated exposure control, iterative reconstruction, and/or weight-based adjustment of the mA/kV was utilized to reduce the radiation dose to as low as reasonably achievable. COMPARISON: None available. CLINICAL HISTORY: RLQ abdominal pain. FINDINGS: LOWER CHEST: No acute abnormality. LIVER: The liver is 22 cm in length and moderately steatotic without mass or enhancement. GALLBLADDER AND BILE DUCTS: Gallbladder is unremarkable. No biliary ductal dilatation. SPLEEN: The spleen is mildly enlarged, measuring 15.2 cm transverse, without mass. PANCREAS: There is mild fatty infiltration in the pancreas, but no mass, ductal dilatation or inflammatory change. ADRENAL GLANDS: No acute abnormality. KIDNEYS, URETERS AND BLADDER: No stones in the kidneys or ureters. No hydronephrosis. No perinephric or periureteral stranding. Urinary bladder is unremarkable. GI AND BOWEL: Stomach demonstrates no acute abnormality. There is no bowel obstruction. Normal caliber small bowel without inflammatory change. There is a 1 cm stone in the proximal appendix, with fluid distention and inflammation of the remainder consistent with appendicitis. Maximum appendiceal caliber 1.4 cm with trace periappendiceal reactive fluid but no overt appendiceal rupture or abscess. The colon is contracted and unremarkable. PERITONEUM AND RETROPERITONEUM: No ascites. No free air. VASCULATURE: Aorta is normal in caliber. There is moderate aortoiliac calcific plaque, age advanced. LYMPH NODES: No lymphadenopathy. REPRODUCTIVE ORGANS: Enlarged prostate, measures 4.7 cm transverse. BONES AND SOFT TISSUES: There are mild degenerative disc changes in the lower thoracic spine. Multiple bone islands in both femoral heads. There are small umbilical and inguinal fat hernias. No incarcerated hernia. No acute osseous abnormality. No focal soft tissue abnormality. IMPRESSION: 1. Acute appendicitis with 1 cm appendicolith in the proximal lumen,  dilated appendix, and periappendiceal inflammation without rupture or abscess. 2. Moderate aortoiliac atherosclerotic calcification, age-advanced. 3. PRA is attempting to reach the ordering physician for stat notification at the time of signing. 4. Moderate hepatic steatosis. Mild splenomegaly. Electronically signed by: Francis Quam MD 08/03/2024 07:08 AM EST RP Workstation: [id number]    Anti-infectives (From admission, onward)    Start     Dose/Rate Route Frequency Ordered Stop   08/03/24 0815  cefTRIAXone (ROCEPHIN) 2 g in sodium chloride 0.9 % 100 mL IVPB        2 g 200 mL/hr over 30 Minutes Intravenous Every 24 hours 08/03/24 0805     08/03/24 0815  metroNIDAZOLE (FLAGYL) IVPB 500 mg        500 mg 100 mL/hr over 60 Minutes Intravenous Every 12 hours 08/03/24 0805  Assessment/Plan Acute Appendicitis History, exam and imaging consistent with acute appendicitis. No evidence of perforation or abscess on CT. He does have an appendicolith. WBC 15.2. HDS. Discussed operative vs non-operative intervention.  I have explained the procedure, risks, and aftercare of Laparoscopic Appendectomy.  Risks include but are not limited to anesthesia (MI, CVA, death, aspiration, prolonged intubation), bleeding, infection, wound problems, hernia, injury to surrounding structures (viscus, nerves, blood vessels, ureter), need for conversion to open procedure or ileocecectomy, post operative ileus or abscess, stump leak, stump appendicitis and increased risk of DVT/PE.  He seems to understand and agrees to proceed with surgery. Keep NPO. He reports last PO intake was at 5am (2 crackers) - will talk with anesthesia. Start IV abx.   FEN - NPO VTE - SCDs ID - Rocephin/Flagyl Foley - None currently.  Dispo - To OR. Possible d/c from PACU. If requires admission postoperatively, will plan admission to observation.   I reviewed nursing notes, last 24 h vitals and pain scores, last 48 h intake and output, last  24 h labs and trends, and last 24 h imaging results.   Ozell CHRISTELLA Shaper, [facility] Surgery 08/03/2024, 8:05 AM Please see Amion for pager number during day hours 7:00am-4:30pm

## 2024-08-03 NOTE — Anesthesia Procedure Notes (Signed)
 Procedure Name: Intubation Date/Time: 08/03/2024 10:40 AM  Performed by: Carolee Lauraine DASEN, CRNAPre-anesthesia Checklist: Patient identified, Emergency Drugs available, Suction available and Patient being monitored Patient Re-evaluated:Patient Re-evaluated prior to induction Oxygen Delivery Method: Circle System Utilized Preoxygenation: Pre-oxygenation with 100% oxygen Induction Type: IV induction, Rapid sequence and Cricoid Pressure applied Laryngoscope Size: Miller and 2 Grade View: Grade I Tube type: Oral Tube size: 7.5 mm Number of attempts: 1 Airway Equipment and Method: Stylet and Oral airway Placement Confirmation: ETT inserted through vocal cords under direct vision, positive ETCO2 and breath sounds checked- equal and bilateral Secured at: 23 cm Tube secured with: Tape Dental Injury: Teeth and Oropharynx as per pre-operative assessment

## 2024-08-03 NOTE — ED Notes (Signed)
 Patient transported to CT scan .

## 2024-08-04 ENCOUNTER — Encounter (HOSPITAL_COMMUNITY): Payer: Self-pay | Admitting: General Surgery

## 2024-08-04 LAB — SURGICAL PATHOLOGY
